# Patient Record
Sex: Male | Born: 1980 | Race: White | Hispanic: No | Marital: Single | Smoking: Current every day smoker
Health system: Southern US, Community
[De-identification: ages and names within clinical notes are randomized; demographics above are authoritative.]

---
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
## 2007-08-07 ENCOUNTER — Emergency Department (HOSPITAL_COMMUNITY): Admission: EM | Admit: 2007-08-07 | Discharge: 2007-08-07 | Payer: Self-pay | Admitting: Emergency Medicine

## 2009-09-04 ENCOUNTER — Emergency Department (HOSPITAL_COMMUNITY): Admission: EM | Admit: 2009-09-04 | Discharge: 2009-09-04 | Payer: Self-pay | Admitting: Emergency Medicine

## 2009-09-09 ENCOUNTER — Encounter: Payer: Self-pay | Admitting: Orthopedic Surgery

## 2009-09-10 ENCOUNTER — Ambulatory Visit: Payer: Self-pay | Admitting: Orthopedic Surgery

## 2009-09-10 DIAGNOSIS — S41109A Unspecified open wound of unspecified upper arm, initial encounter: Secondary | ICD-10-CM | POA: Insufficient documentation

## 2009-09-12 ENCOUNTER — Ambulatory Visit: Payer: Self-pay | Admitting: Orthopedic Surgery

## 2010-02-04 NOTE — Assessment & Plan Note (Signed)
Summary: RE-CK LT ARM/WOUND CHECK/SELF PAY/CAF   Visit Type:  Follow-up Referring Jose Banks:  ap er Primary Jose Banks:  [facility]. [facility]  CC:  recheck left arm wound.  History of Present Illness: I saw Jose Banks in the office today for an initial visit.  He is a 30 years old man with the complaint of:  left arm lacerations.  DOI 09/04/09.  He basically punched a window on August 31 and sustained the lacerations over the LEFT upper arm and forearm.  Current medications Percocet  Here for wound check and suture removal  His biceps tendon remains intact as stated previously a suture line looks good his sutures were removed he is discharged       Allergies: No Known Drug Allergies   Impression & Recommendations:  Problem # 1:  WOUND, OPEN, ARM, WITHOUT COMPLICATION (ICD-884.0) Assessment Improved  Orders: Est. Patient Level II (86578)  Patient Instructions: 1)  follow up as needed

## 2010-02-04 NOTE — Letter (Signed)
Summary: History form  History form   Imported By: Jacklynn Ganong 09/12/2009 16:22:00  _____________________________________________________________________  External Attachment:    Type:   Image     Comment:   External Document

## 2010-02-04 NOTE — Assessment & Plan Note (Signed)
Summary: [facility] LAC LEFT ARM/AWARE TO PAY$100/BSF   Vital Signs:  Patient profile:   30 year old male Height:      66 inches Weight:      124 pounds Pulse rate:   82 / minute Resp:     16 per minute  Vitals Entered By: Fuller Canada MD (September 10, 2009 2:06 PM)  Visit Type:  new patient Referring Provider:  ap er Primary Provider:  [facility]  CC:  left arm pain.  History of Present Illness: I saw Jose Banks in the office today for an initial visit.  He is a 30 years old man with the complaint of:  left arm lacerations.  DOI 09/04/09.  Had sutures and was given Percocet 5 from er 09/04/09.  Patient has a laceration 5 cm over the distal portion of the upper arm then one over the lateral forearm.  His sharp dull throbbing pain which is 6/10 and it is intermittent appears to be worsened with movement it's associated with tingling.  He basically punched a window on August 31 and sustained the lacerations  He is on Percocet for pain he took his sling off because it hurt more.  I measured his laceration hematoma 10 cm.  His biceps tendon is intact.      Allergies (verified): No Known Drug Allergies  Past History:  Past Medical History: Cerebral Palsy  Past Surgical History: heal condition knee hamstring hip  Family History: FH of Cancer:  Family History of Diabetes Family History Coronary Heart Disease male < 30 Family History of Arthritis  Social History: Patient is single.  unemployed smokes 1ppd drinks alcohol occasionally 16 oz of caffeine per day 10th grade ed.  Review of Systems General:  Denies weight loss, weight gain, fever, chills, and fatigue. Cardiac :  Denies chest pain, palpitations, fainting, and murmurs. Resp:  Complains of snoring; denies short of breath, wheezing, couch, tightness, pain on inspiration, and snoring . GI:  Denies heartburn, nausea, vomiting, diarrhea, constipation, and blood in your  stools. GU:  Denies frequency, urgency, difficulty urinating, painful urination, flank pain, and bleeding in urine. Neuro:  Complains of tingling; denies numbness, unsteady gait, dizziness, tremors, and seizure. MS:  Complains of joint pain, stiffness, and muscle pain; denies swelling, instability, redness, and heat. Endo:  Denies excessive thirst, exessive urination, and heat or cold intolerance. Psych:  Denies nervousness, depression, anxiety, and hallucinations. Derm:  Denies changes in the skin, poor healing, rash, itching, and redness. EENT:  Denies blurred or double vision, eye pain, redness, and watering. Immunology:  Complains of seasonal allergies; denies sinus problems and allergic to bee stings. Lymphatic:  Denies easy bleeding and brusing.  Physical Exam  Additional Exam:  Vital signs are recorded stable.  He is awake alert and oriented x3 mood and affect are normal.  Gait and station are normal.  There is somewhat of a defect in the muscle belly of the biceps but the tendon is intact with the finger hook passed into supination only has mild weakness his range of motion the elbow is normal his elbow is stable the skin lacerations as noted suture line looks a little red and both areas and there is a cream which he says is Neosporin on it the radial and ulnar pulses are normal there is minimal edema to the arm or no sensory deficits   Impression & Recommendations:  Problem # 1:  WOUND, OPEN, ARM, WITHOUT COMPLICATION (ICD-884.0) Assessment New  Orders: New Patient Level II (96045)  Patient Instructions: 1)  2 days remove sutures  2)  continue neosporin cream

## 2010-12-22 ENCOUNTER — Encounter: Payer: Self-pay | Admitting: *Deleted

## 2010-12-22 ENCOUNTER — Emergency Department (HOSPITAL_COMMUNITY): Payer: Self-pay

## 2010-12-22 ENCOUNTER — Emergency Department (HOSPITAL_COMMUNITY)
Admission: EM | Admit: 2010-12-22 | Discharge: 2010-12-22 | Disposition: A | Discharge status: Home / Self Care | Payer: Self-pay | Attending: Emergency Medicine | Admitting: Emergency Medicine

## 2010-12-22 DIAGNOSIS — R059 Cough, unspecified: Secondary | ICD-10-CM | POA: Insufficient documentation

## 2010-12-22 DIAGNOSIS — B349 Viral infection, unspecified: Secondary | ICD-10-CM

## 2010-12-22 DIAGNOSIS — J3489 Other specified disorders of nose and nasal sinuses: Secondary | ICD-10-CM | POA: Insufficient documentation

## 2010-12-22 DIAGNOSIS — R509 Fever, unspecified: Secondary | ICD-10-CM | POA: Insufficient documentation

## 2010-12-22 DIAGNOSIS — J111 Influenza due to unidentified influenza virus with other respiratory manifestations: Secondary | ICD-10-CM | POA: Insufficient documentation

## 2010-12-22 DIAGNOSIS — IMO0001 Reserved for inherently not codable concepts without codable children: Secondary | ICD-10-CM | POA: Insufficient documentation

## 2010-12-22 DIAGNOSIS — R05 Cough: Secondary | ICD-10-CM | POA: Insufficient documentation

## 2010-12-22 DIAGNOSIS — B9789 Other viral agents as the cause of diseases classified elsewhere: Secondary | ICD-10-CM | POA: Insufficient documentation

## 2010-12-22 DIAGNOSIS — R112 Nausea with vomiting, unspecified: Secondary | ICD-10-CM | POA: Insufficient documentation

## 2010-12-22 LAB — URINALYSIS, ROUTINE W REFLEX MICROSCOPIC
Ketones, ur: NEGATIVE mg/dL
Leukocytes, UA: NEGATIVE
Nitrite: NEGATIVE
pH: 8 (ref 5.0–8.0)

## 2010-12-22 LAB — BASIC METABOLIC PANEL
Calcium: 9.8 mg/dL (ref 8.4–10.5)
Chloride: 96 mEq/L (ref 96–112)
Creatinine, Ser: 1.15 mg/dL (ref 0.50–1.35)
GFR calc Af Amer: >90 mL/min (ref >90)
Sodium: 135 mEq/L (ref 135–145)

## 2010-12-22 LAB — URINE MICROSCOPIC-ADD ON

## 2010-12-22 MED ORDER — KETOROLAC TROMETHAMINE 30 MG/ML IJ SOLN
30.0000 mg | Freq: Once | INTRAMUSCULAR | Status: AC
  Administered 2010-12-22: 30 mg via INTRAVENOUS
  Filled 2010-12-22: qty 1

## 2010-12-22 MED ORDER — SODIUM CHLORIDE 0.9 % IV BOLUS (SEPSIS)
1000.0000 mL | Freq: Once | INTRAVENOUS | Status: AC
  Administered 2010-12-22: 1000 mL via INTRAVENOUS

## 2010-12-22 MED ORDER — PROMETHAZINE HCL 25 MG PO TABS: 25.0000 mg | ORAL_TABLET | Freq: Four times a day (QID) | ORAL | Status: AC | PRN

## 2010-12-22 MED ORDER — MORPHINE SULFATE 4 MG/ML IJ SOLN
4.0000 mg | Freq: Once | INTRAMUSCULAR | Status: AC
  Administered 2010-12-22: 4 mg via INTRAVENOUS
  Filled 2010-12-22: qty 1

## 2010-12-22 MED ORDER — ONDANSETRON HCL 4 MG/2ML IJ SOLN
4.0000 mg | Freq: Once | INTRAMUSCULAR | Status: AC
  Administered 2010-12-22: 4 mg via INTRAVENOUS
  Filled 2010-12-22: qty 2

## 2010-12-22 NOTE — ED Provider Notes (Signed)
History     CSN: [id number] Arrival date & time: 12/22/2010  8:35 PM   First MD Initiated Contact with Patient 12/22/10 2036      Chief Complaint  Patient presents with  . Nausea  . Emesis  . Generalized Body Aches    (Consider location/radiation/quality/duration/timing/severity/associated sxs/prior treatment) HPI Comments: The past 3 days patient states he's had cough, congestion, rhinorrhea, fever, chills. Fevers but subjective. For the past one to 2 days has had nausea and multiple episodes of nonbloody nonbilious emesis. States he is unable to tolerate any oral intake including fluids. Has no chest pain, abdominal pain, urinary symptoms. No diarrhea  Patient is a 30 y.o. male presenting with vomiting. The history is provided by the patient. No language interpreter was used.  Emesis  This is a new problem. The current episode started 2 days ago. The problem occurs 2 to 4 times per day. The problem has been gradually worsening. The emesis has an appearance of stomach contents. Maximum temperature: unknown. The fever has been present for 1 to 2 days. Associated symptoms include chills, cough, a fever, myalgias and URI. Pertinent negatives include no abdominal pain, no arthralgias, no diarrhea and no headaches.    History reviewed. No pertinent past medical history.  History reviewed. No pertinent past surgical history.  History reviewed. No pertinent family history.  History  Substance Use Topics  . Smoking status: Current Everyday Smoker -- 1.0 packs/day  . Smokeless tobacco: Not on file  . Alcohol Use: No      Review of Systems  Constitutional: Positive for fever, chills, activity change, appetite change and fatigue.  HENT: Positive for congestion and rhinorrhea. Negative for sore throat, neck pain and neck stiffness.   Respiratory: Positive for cough. Negative for chest tightness and shortness of breath.   Cardiovascular: Negative for chest pain and palpitations.    Gastrointestinal: Positive for nausea and vomiting. Negative for abdominal pain and diarrhea.  Genitourinary: Negative for dysuria, urgency, frequency and flank pain.  Musculoskeletal: Positive for myalgias. Negative for back pain and arthralgias.  Neurological: Negative for dizziness, weakness, light-headedness, numbness and headaches.  All other systems reviewed and are negative.    Allergies  Review of patient's allergies indicates no known allergies.  Home Medications   Current Outpatient Rx  Name Route Sig Dispense Refill  . PROMETHAZINE HCL 25 MG PO TABS Oral Take 1 tablet (25 mg total) by mouth every 6 (six) hours as needed for nausea. 20 tablet 0    BP 117/76  Pulse 87  Temp(Src) 98.8 F (37.1 C) (Oral)  Resp 18  Ht 5' 5.5" (1.664 m)  Wt 135 lb (61.236 kg)  BMI 22.12 kg/m2  SpO2 94%  Physical Exam  Nursing note and vitals reviewed. Constitutional: He is oriented to person, place, and time. He appears well-developed and well-nourished. No distress.       Appears uncomfortable but in no distress  HENT:  Head: Normocephalic and atraumatic.  Mouth/Throat: Oropharynx is clear and moist. No oropharyngeal exudate.  Eyes: Conjunctivae and EOM are normal. Pupils are equal, round, and reactive to light.  Neck: Normal range of motion. Neck supple.  Cardiovascular: Normal rate, regular rhythm, normal heart sounds and intact distal pulses.  Exam reveals no gallop and no friction rub.   No murmur heard. Pulmonary/Chest: Effort normal and breath sounds normal. No respiratory distress. He has no wheezes. He has no rales.  Abdominal: Soft. Bowel sounds are normal. There is no tenderness.  Musculoskeletal: Normal  range of motion. He exhibits no tenderness.  Lymphadenopathy:    He has no cervical adenopathy.  Neurological: He is alert and oriented to person, place, and time. No cranial nerve deficit.  Skin: Skin is warm and dry. No rash noted.    ED Course  Procedures  (including critical care time)  Labs Reviewed  BASIC METABOLIC PANEL - Abnormal; Notable for the following:    Glucose, Bld 101 (*)    GFR calc non Af Amer 85 (*)    All other components within normal limits  URINALYSIS, ROUTINE W REFLEX MICROSCOPIC   Dg Chest 2 View  12/22/2010  *RADIOLOGY REPORT*  Clinical Data: Cough, fever  CHEST - 2 VIEW  Comparison:  None.  Findings:  The heart size and mediastinal contours are within normal limits.  Both lungs are clear.  The visualized skeletal structures are unremarkable.  IMPRESSION: No active cardiopulmonary disease.  Original Report Authenticated By: Judie Petit. Ruel Favors, M.D.     1. Viral syndrome   2. Influenza       MDM  Patient likely has influenza-like illness. Basic metabolic panel and urine were obtained given his history of 3 days of vomiting unable to tolerate by mouth. There is no indication for an additional laboratory studies. Chest x-ray is performed and pending at time of this dictation. IV was placed he received antiemetics and medication for discomfort. Given his persistent nausea and vomiting he will require by mouth challenge prior to discharge.  I discussed the case with my colleague dr Deretha Emory who will discharge the patient if po challenge successful.  To be provided rx for phenergan.  Out of treatment window for influenza tx        Dayton Bailiff, MD 12/22/10 2157

## 2010-12-22 NOTE — ED Notes (Signed)
Patient states he feels better now than when he got here.

## 2010-12-22 NOTE — ED Notes (Addendum)
"  Sick for 3 days."  Reports fever and chills.  Nausea and vomiting.  Reports generalized body aches.  Reports taking Alka-Seltzer Cold to treat at home.  No relief.

## 2011-08-15 ENCOUNTER — Encounter (HOSPITAL_COMMUNITY): Payer: Self-pay | Admitting: *Deleted

## 2011-08-15 ENCOUNTER — Emergency Department (HOSPITAL_COMMUNITY)
Admission: EM | Admit: 2011-08-15 | Discharge: 2011-08-15 | Disposition: A | Discharge status: Home / Self Care | Payer: Self-pay | Attending: Emergency Medicine | Admitting: Emergency Medicine

## 2011-08-15 DIAGNOSIS — F17200 Nicotine dependence, unspecified, uncomplicated: Secondary | ICD-10-CM | POA: Insufficient documentation

## 2011-08-15 DIAGNOSIS — L02415 Cutaneous abscess of right lower limb: Secondary | ICD-10-CM

## 2011-08-15 DIAGNOSIS — L02419 Cutaneous abscess of limb, unspecified: Secondary | ICD-10-CM | POA: Insufficient documentation

## 2011-08-15 MED ORDER — DOXYCYCLINE HYCLATE 100 MG PO CAPS: 100.0000 mg | ORAL_CAPSULE | Freq: Two times a day (BID) | ORAL | Status: AC

## 2011-08-15 MED ORDER — LIDOCAINE HCL (PF) 1 % IJ SOLN
INTRAMUSCULAR | Status: AC
  Filled 2011-08-15: qty 5

## 2011-08-15 MED ORDER — DOXYCYCLINE HYCLATE 100 MG PO TABS
100.0000 mg | ORAL_TABLET | Freq: Once | ORAL | Status: AC
  Administered 2011-08-15: 100 mg via ORAL
  Filled 2011-08-15: qty 1

## 2011-08-15 NOTE — ED Provider Notes (Signed)
History     CSN: [id number]  Arrival date & time 08/15/11  1101   First MD Initiated Contact with Patient 08/15/11 1141      Chief Complaint  Patient presents with  . Abscess    (Consider location/radiation/quality/duration/timing/severity/associated sxs/prior treatment) HPI Comments: Had a "zit or an ingrown hair" that he squeezed.  It has since become red, swollen and tender.  No fever.  No h/o MRSA.  Patient is a 31 y.o. male presenting with abscess. The history is provided by the patient. No language interpreter was used.  Abscess  This is a new problem. Episode onset: several days ago. The problem has been gradually worsening. The abscess is present on the right upper leg. Pertinent negatives include no fever. There were no sick contacts. He has received no recent medical care.    History reviewed. No pertinent past medical history.  History reviewed. No pertinent past surgical history.  No family history on file.  History  Substance Use Topics  . Smoking status: Current Everyday Smoker -- 1.0 packs/day  . Smokeless tobacco: Not on file  . Alcohol Use: No      Review of Systems  Constitutional: Negative for fever and chills.  Skin:       Abscess   All other systems reviewed and are negative.    Allergies  Review of patient's allergies indicates no known allergies.  Home Medications   Current Outpatient Rx  Name Route Sig Dispense Refill  . DOXYCYCLINE HYCLATE 100 MG PO CAPS Oral Take 1 capsule (100 mg total) by mouth 2 (two) times daily. 20 capsule 0    BP 125/82  Pulse 60  Temp 98.4 F (36.9 C)  Resp 18  Ht 5' 5.5" (1.664 m)  Wt 125 lb (56.7 kg)  BMI 20.48 kg/m2  SpO2 100%  Physical Exam  Nursing note and vitals reviewed. Constitutional: He is oriented to person, place, and time. He appears well-developed and well-nourished.  HENT:  Head: Normocephalic and atraumatic.  Eyes: EOM are normal.  Neck: Normal range of motion.  Cardiovascular:  Normal rate, regular rhythm, normal heart sounds and intact distal pulses.   Pulmonary/Chest: Effort normal and breath sounds normal. No respiratory distress.  Abdominal: Soft. He exhibits no distension. There is no tenderness.  Musculoskeletal: Normal range of motion.       Legs: Neurological: He is alert and oriented to person, place, and time.  Skin: Skin is warm and dry.  Psychiatric: He has a normal mood and affect. Judgment normal.    ED Course  Procedures (including critical care time)  Labs Reviewed - No data to display No results found.   1. Abscess of right thigh       MDM  rx-doxycycline 100 mg, 20 Ibuprofen  Heat Return prn.        Evalina Field, [place] 08/15/11 1253

## 2011-08-15 NOTE — ED Provider Notes (Signed)
Medical screening examination/treatment/procedure(s) were performed by non-physician practitioner and as supervising physician I was immediately available for consultation/collaboration.  Donnetta Hutching, MD 08/15/11 1517

## 2011-08-15 NOTE — ED Notes (Signed)
Pt ? Abscess to right thigh area that started Monday, denise any drainage, pt states that he thought he could "pop" it and it would go away

## 2011-08-15 NOTE — ED Notes (Signed)
Pt presents to ED with abscess on left anterior thigh. Pt states he first noticed the area on Monday but initially thought it was an ingrown hair. Pt denies drainage from area but states redness has gotten worse each day.

## 2011-09-22 ENCOUNTER — Emergency Department (HOSPITAL_COMMUNITY): Payer: Self-pay

## 2011-09-22 ENCOUNTER — Emergency Department (HOSPITAL_COMMUNITY)
Admission: EM | Admit: 2011-09-22 | Discharge: 2011-09-22 | Disposition: A | Discharge status: Home / Self Care | Payer: Self-pay | Attending: Emergency Medicine | Admitting: Emergency Medicine

## 2011-09-22 ENCOUNTER — Encounter (HOSPITAL_COMMUNITY): Payer: Self-pay | Admitting: *Deleted

## 2011-09-22 DIAGNOSIS — W298XXA Contact with other powered powered hand tools and household machinery, initial encounter: Secondary | ICD-10-CM | POA: Insufficient documentation

## 2011-09-22 DIAGNOSIS — S61209A Unspecified open wound of unspecified finger without damage to nail, initial encounter: Secondary | ICD-10-CM | POA: Insufficient documentation

## 2011-09-22 DIAGNOSIS — F17200 Nicotine dependence, unspecified, uncomplicated: Secondary | ICD-10-CM | POA: Insufficient documentation

## 2011-09-22 DIAGNOSIS — S61219A Laceration without foreign body of unspecified finger without damage to nail, initial encounter: Secondary | ICD-10-CM

## 2011-09-22 MED ORDER — BACITRACIN ZINC 500 UNIT/GM EX OINT
TOPICAL_OINTMENT | CUTANEOUS | Status: AC
  Filled 2011-09-22: qty 0.9

## 2011-09-22 MED ORDER — LIDOCAINE HCL (PF) 1 % IJ SOLN
INTRAMUSCULAR | Status: AC
  Administered 2011-09-22: 20:00:00
  Filled 2011-09-22: qty 5

## 2011-09-22 NOTE — ED Notes (Signed)
Pt cut self with a hand saw, lac to right index finger, occurred at 0900 today, pt states bleeding continues after dressing taken off

## 2011-09-22 NOTE — ED Provider Notes (Signed)
History     CSN: [id number]  Arrival date & time 09/22/11  1830   First MD Initiated Contact with Patient 09/22/11 1926      Chief Complaint  Patient presents with  . Extremity Laceration    (Consider location/radiation/quality/duration/timing/severity/associated sxs/prior treatment) HPI Comments: Using a hand saw and slipped cutting R distal index finger.  DT UTD.  L hand dominant.  No other injuries or complaints.  The history is provided by the patient. No language interpreter was used.    History reviewed. No pertinent past medical history.  History reviewed. No pertinent past surgical history.  History reviewed. No pertinent family history.  History  Substance Use Topics  . Smoking status: Current Every Day Smoker -- 1.0 packs/day    Types: Cigarettes  . Smokeless tobacco: Not on file  . Alcohol Use: Yes     very rare      Review of Systems  Skin: Positive for wound.  Neurological: Negative for weakness and numbness.  All other systems reviewed and are negative.    Allergies  Review of patient's allergies indicates no known allergies.  Home Medications  No current outpatient prescriptions on file.  BP 140/84  Pulse 95  Temp 98.5 F (36.9 C) (Oral)  Resp 18  Ht 5' 5.5" (1.664 m)  Wt 125 lb (56.7 kg)  BMI 20.48 kg/m2  SpO2 98%  Physical Exam  Nursing note and vitals reviewed. Constitutional: He is oriented to person, place, and time. He appears well-developed and well-nourished.  HENT:  Head: Normocephalic and atraumatic.  Eyes: EOM are normal.  Neck: Normal range of motion.  Cardiovascular: Normal rate, regular rhythm, normal heart sounds and intact distal pulses.   Pulmonary/Chest: Effort normal and breath sounds normal. No respiratory distress.  Abdominal: Soft. He exhibits no distension. There is no tenderness.  Musculoskeletal: He exhibits tenderness.       Right hand: He exhibits tenderness and laceration. He exhibits normal range of  motion, no bony tenderness, normal capillary refill, no deformity and no swelling. normal sensation noted. Normal strength noted.       Hands: Neurological: He is alert and oriented to person, place, and time.  Skin: Skin is warm and dry.  Psychiatric: He has a normal mood and affect. Judgment normal.    ED Course  LACERATION REPAIR Date/Time: 09/22/2011 7:48 PM Performed by: Evalina Field Authorized by: Evalina Field Consent: Verbal consent obtained. Written consent not obtained. Risks and benefits: risks, benefits and alternatives were discussed Consent given by: patient Patient understanding: patient states understanding of the procedure being performed Patient consent: the patient's understanding of the procedure matches consent given Site marked: the operative site was not marked Imaging studies: imaging studies available Patient identity confirmed: verbally with patient Time out: Immediately prior to procedure a "time out" was called to verify the correct patient, procedure, equipment, support staff and site/side marked as required. Location: R 2nd finger. Laceration length: 2.8 cm Foreign bodies: no foreign bodies Tendon involvement: none Nerve involvement: none Vascular damage: no Anesthesia: local infiltration Local anesthetic: lidocaine 1% without epinephrine Anesthetic total: 2 ml Patient sedated: no Preparation: Patient was prepped and draped in the usual sterile fashion. Irrigation solution: saline Irrigation method: syringe Amount of cleaning: standard Debridement: none Degree of undermining: none Skin closure: 4-0 nylon Number of sutures: 5 Technique: simple Approximation: close Approximation difficulty: simple Dressing: 4x4 sterile gauze and antibiotic ointment Patient tolerance: Patient tolerated the procedure well with no immediate complications.   (including critical  care time)  Labs Reviewed - No data to display Dg Finger Index Right  09/22/2011   *RADIOLOGY REPORT*  Clinical Data: Right index finger laceration.  RIGHT INDEX FINGER 2+V  Comparison: None.  Findings: Bandaging noted.  No foreign body or underlying osseous abnormality.  IMPRESSION:  1.  No foreign body, fracture, or acute osseous abnormality observed.   Original Report Authenticated By: Dellia Cloud, M.D.      1. Finger laceration       MDM  No fxs  Wash/abx oint BID Suture removal in 8-10 days.        Evalina Field, [place] 09/22/11 2012

## 2011-09-22 NOTE — ED Notes (Signed)
Pt presents to ED with laceration to right index finger. Pt states he cut finger with a hand saw. Pt denies pain.

## 2011-09-23 NOTE — ED Provider Notes (Signed)
Medical screening examination/treatment/procedure(s) were performed by non-physician practitioner and as supervising physician I was immediately available for consultation/collaboration  Takiya Belmares R. Valerye Kobus, MD 09/23/11 0022 

## 2011-11-04 ENCOUNTER — Encounter (HOSPITAL_COMMUNITY): Payer: Self-pay | Admitting: *Deleted

## 2011-11-04 ENCOUNTER — Emergency Department (HOSPITAL_COMMUNITY)
Admission: EM | Admit: 2011-11-04 | Discharge: 2011-11-04 | Disposition: A | Discharge status: Home / Self Care | Payer: Self-pay | Attending: Emergency Medicine | Admitting: Emergency Medicine

## 2011-11-04 DIAGNOSIS — I951 Orthostatic hypotension: Secondary | ICD-10-CM | POA: Insufficient documentation

## 2011-11-04 DIAGNOSIS — M545 Low back pain, unspecified: Secondary | ICD-10-CM | POA: Insufficient documentation

## 2011-11-04 DIAGNOSIS — R630 Anorexia: Secondary | ICD-10-CM | POA: Insufficient documentation

## 2011-11-04 DIAGNOSIS — M5410 Radiculopathy, site unspecified: Secondary | ICD-10-CM

## 2011-11-04 DIAGNOSIS — IMO0002 Reserved for concepts with insufficient information to code with codable children: Secondary | ICD-10-CM | POA: Insufficient documentation

## 2011-11-04 DIAGNOSIS — F17200 Nicotine dependence, unspecified, uncomplicated: Secondary | ICD-10-CM | POA: Insufficient documentation

## 2011-11-04 DIAGNOSIS — R110 Nausea: Secondary | ICD-10-CM | POA: Insufficient documentation

## 2011-11-04 MED ORDER — IBUPROFEN 600 MG PO TABS: 600.0000 mg | ORAL_TABLET | Freq: Four times a day (QID) | ORAL | Status: DC | PRN

## 2011-11-04 MED ORDER — HYDROCODONE-ACETAMINOPHEN 5-325 MG PO TABS
2.0000 | ORAL_TABLET | Freq: Once | ORAL | Status: DC
  Filled 2011-11-04: qty 2

## 2011-11-04 MED ORDER — KETOROLAC TROMETHAMINE 60 MG/2ML IM SOLN
60.0000 mg | Freq: Once | INTRAMUSCULAR | Status: AC
  Administered 2011-11-04: 60 mg via INTRAMUSCULAR
  Filled 2011-11-04: qty 2

## 2011-11-04 MED ORDER — HYDROCODONE-ACETAMINOPHEN 5-325 MG PO TABS: 1.0000 | ORAL_TABLET | ORAL | Status: DC | PRN

## 2011-11-04 NOTE — ED Provider Notes (Signed)
History  This chart was scribed for Derwood Kaplan, MD by Shari Heritage. The patient was seen in room [facility]17/[facility]. Patient's care was started at 1146.     CSN: [id number]  Arrival date & time 11/04/11  1058   First MD Initiated Contact with Patient 11/04/11 1146      Chief Complaint  Patient presents with  . Dizziness    Patient is a 31 y.o. male presenting with back pain. The history is provided by the patient. No language interpreter was used.  Back Pain  This is a new problem. The current episode started yesterday. The problem occurs constantly. The problem has not changed since onset.The pain is associated with no known injury. The pain is present in the lumbar spine. The pain radiates to the left thigh and right thigh. The pain is moderate. The symptoms are aggravated by certain positions. Pertinent negatives include no numbness, no bowel incontinence, no bladder incontinence and no weakness. He has tried NSAIDs for the symptoms. The treatment provided mild relief.    HPI Comments: Jose Banks is a 31 y.o. male who presents to the Emergency Department complaining of moderate, constant, lumbar back pain that radiates down to his thighs bilaterally onset 1 day ago. Patient states that certain positions make pain worse. He has taken Ibuprofen and Alka Seltzer for relief. He denies urinary incontinence, bowel incontinence, numbness, tingling or weakness.   Patient is also complaining of lightheadedness onset 2 days ago. There is associated nausea and decreased appetite. Patient denies vomiting. Patient reports no other significant past medical or surgical history. He is a current every day smoker.   History reviewed. No pertinent past medical history.  History reviewed. No pertinent past surgical history.  No family history on file.  History  Substance Use Topics  . Smoking status: Current Every Day Smoker -- 1.0 packs/day    Types: Cigarettes  . Smokeless tobacco: Not on file   . Alcohol Use: Yes     very rare      Review of Systems  Constitutional: Positive for appetite change.  Gastrointestinal: Positive for nausea. Negative for vomiting and bowel incontinence.  Genitourinary: Negative for bladder incontinence.  Musculoskeletal: Positive for back pain.  Neurological: Positive for light-headedness. Negative for weakness and numbness.    Allergies  Review of patient's allergies indicates no known allergies.  Home Medications   Current Outpatient Rx  Name Route Sig Dispense Refill  . ASPIRIN EFFERVESCENT 325 MG PO TBEF Oral Take 325 mg by mouth every 6 (six) hours as needed. pain    . IBUPROFEN 400 MG PO TABS Oral Take 400 mg by mouth every 8 (eight) hours as needed. Back and leg pain and headache      BP 120/66  Pulse 99  Temp 98.5 F (36.9 C) (Oral)  Resp 20  Ht 5\' 6"  (1.676 m)  Wt 125 lb (56.7 kg)  BMI 20.18 kg/m2  SpO2 97%  Physical Exam  Constitutional: He is oriented to person, place, and time. He appears well-developed and well-nourished.  HENT:  Head: Normocephalic and atraumatic.  Mouth/Throat: Mucous membranes are normal. Mucous membranes are not dry.  Cardiovascular: Normal rate and regular rhythm.   No murmur heard. Pulmonary/Chest: Effort normal and breath sounds normal. No respiratory distress. He has no wheezes. He has no rales.  Abdominal: Soft. There is no hepatosplenomegaly or hepatomegaly. There is tenderness (mild general periumbilical tenderness) in the periumbilical area. There is no rebound, no guarding and no CVA tenderness.  No flank tenderness.  Musculoskeletal: Normal range of motion.       Lumbar back: He exhibits tenderness.       No step offs. Tenderness around L2 region. No erythema. Positive straight leg with right worse than left.  Motor strength of lower extremities is intact bilaterally.  Neurological: He is alert and oriented to person, place, and time.  Skin: Skin is warm and dry.  Psychiatric:  He has a normal mood and affect. His behavior is normal.    ED Course  Procedures (including critical care time) DIAGNOSTIC STUDIES: Oxygen Saturation is 97% on room air, adequate by my interpretation.    COORDINATION OF CARE: 1:01pm- Patient informed of current plan for treatment and evaluation and agrees with plan at this time.      Labs Reviewed - No data to display No results found.   No diagnosis found.    MDM  Medical screening examination/treatment/procedure(s) were performed by me as the supervising physician. Scribe service was utilized for documentation only.  Pt comes in with cc of dizziness. Dizziness is described as lightheadedness. It is positional, there is no associated chest pain, sob, palpitations and he denies any fluid loss to account for orthostasis Pt also c/o of some back pain, radiating down to the legs, and he might be having radicular pain based on the hx. The exam is negative except for straight leg and focal tenderness in the lower thoracic, lumbar region. Pt denies any travel abroad, hx of TB, and he is immunocompetent and at no risk for pathologic fractures or metastasis. No indication for imaging based on above. Will control pain, check orthostatics, which if positive will encourage po hydration.  2:47 PM O/S vitals are equivocal - but the HR did jump by 20+ - so we will encourage po hydration.   Derwood Kaplan, MD 11/04/11 1447

## 2011-11-04 NOTE — ED Notes (Signed)
Cup of water given for fluid challenge, tolerating well.

## 2011-11-04 NOTE — ED Notes (Signed)
C/o swimmy headed, cannot keep anything down, back and leg pain onset yesterday

## 2014-03-03 ENCOUNTER — Emergency Department (HOSPITAL_COMMUNITY): Payer: Self-pay

## 2014-03-03 ENCOUNTER — Encounter (HOSPITAL_COMMUNITY): Payer: Self-pay

## 2014-03-03 ENCOUNTER — Emergency Department (HOSPITAL_COMMUNITY)
Admission: EM | Admit: 2014-03-03 | Discharge: 2014-03-03 | Disposition: A | Discharge status: Home / Self Care | Payer: Self-pay | Attending: Emergency Medicine | Admitting: Emergency Medicine

## 2014-03-03 DIAGNOSIS — Y9389 Activity, other specified: Secondary | ICD-10-CM | POA: Insufficient documentation

## 2014-03-03 DIAGNOSIS — Y998 Other external cause status: Secondary | ICD-10-CM | POA: Insufficient documentation

## 2014-03-03 DIAGNOSIS — Y9289 Other specified places as the place of occurrence of the external cause: Secondary | ICD-10-CM | POA: Insufficient documentation

## 2014-03-03 DIAGNOSIS — S60222A Contusion of left hand, initial encounter: Secondary | ICD-10-CM | POA: Insufficient documentation

## 2014-03-03 DIAGNOSIS — Z720 Tobacco use: Secondary | ICD-10-CM | POA: Insufficient documentation

## 2014-03-03 DIAGNOSIS — S60512A Abrasion of left hand, initial encounter: Secondary | ICD-10-CM

## 2014-03-03 DIAGNOSIS — W228XXA Striking against or struck by other objects, initial encounter: Secondary | ICD-10-CM | POA: Insufficient documentation

## 2014-03-03 MED ORDER — AMOXICILLIN-POT CLAVULANATE 875-125 MG PO TABS: 1.0000 | ORAL_TABLET | Freq: Two times a day (BID) | ORAL | Status: AC

## 2014-03-03 MED ORDER — OXYCODONE-ACETAMINOPHEN 5-325 MG PO TABS: 1.0000 | ORAL_TABLET | ORAL | Status: AC | PRN

## 2014-03-03 NOTE — Discharge Instructions (Signed)
Contusion °A contusion is a deep bruise. Contusions are the result of an injury that caused bleeding under the skin. The contusion may turn blue, purple, or yellow. Minor injuries will give you a painless contusion, but more severe contusions may stay painful and swollen for a few weeks.  °CAUSES  °A contusion is usually caused by a blow, trauma, or direct force to an area of the body. °SYMPTOMS  °· Swelling and redness of the injured area. °· Bruising of the injured area. °· Tenderness and soreness of the injured area. °· Pain. °DIAGNOSIS  °The diagnosis can be made by taking a history and physical exam. An X-ray, CT scan, or MRI may be needed to determine if there were any associated injuries, such as fractures. °TREATMENT  °Specific treatment will depend on what area of the body was injured. In general, the best treatment for a contusion is resting, icing, elevating, and applying cold compresses to the injured area. Over-the-counter medicines may also be recommended for pain control. Ask your caregiver what the best treatment is for your contusion. °HOME CARE INSTRUCTIONS  °· Put ice on the injured area. °¨ Put ice in a plastic bag. °¨ Place a towel between your skin and the bag. °¨ Leave the ice on for 15-20 minutes, 3-4 times a day, or as directed by your health care provider. °· Only take over-the-counter or prescription medicines for pain, discomfort, or fever as directed by your caregiver. Your caregiver may recommend avoiding anti-inflammatory medicines (aspirin, ibuprofen, and naproxen) for 48 hours because these medicines may increase bruising. °· Rest the injured area. °· If possible, elevate the injured area to reduce swelling. °SEEK IMMEDIATE MEDICAL CARE IF:  °· You have increased bruising or swelling. °· You have pain that is getting worse. °· Your swelling or pain is not relieved with medicines. °MAKE SURE YOU:  °· Understand these instructions. °· Will watch your condition. °· Will get help right  away if you are not doing well or get worse. °Document Released: 10/01/2004 Document Revised: 12/27/2012 Document Reviewed: 10/27/2010 °[employer]® Patient Information ©2015 [employer]. This information is not intended to replace advice given to you by your health care provider. Make sure you discuss any questions you have with your health care provider. ° °

## 2014-03-03 NOTE — ED Notes (Signed)
Pt states he was loading wood yesterday and hit his left hand on angle iron
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
## 2016-04-24 IMAGING — CR DG HAND COMPLETE 3+V*L*
3 series · 3 of 3 positions shown · non-contrast
Comparison: None.

CLINICAL DATA: Hit hand on metal with resolved swelling and pain.

EXAM:
LEFT HAND - COMPLETE 3+ VIEW

[view not recorded (1 of 3)]
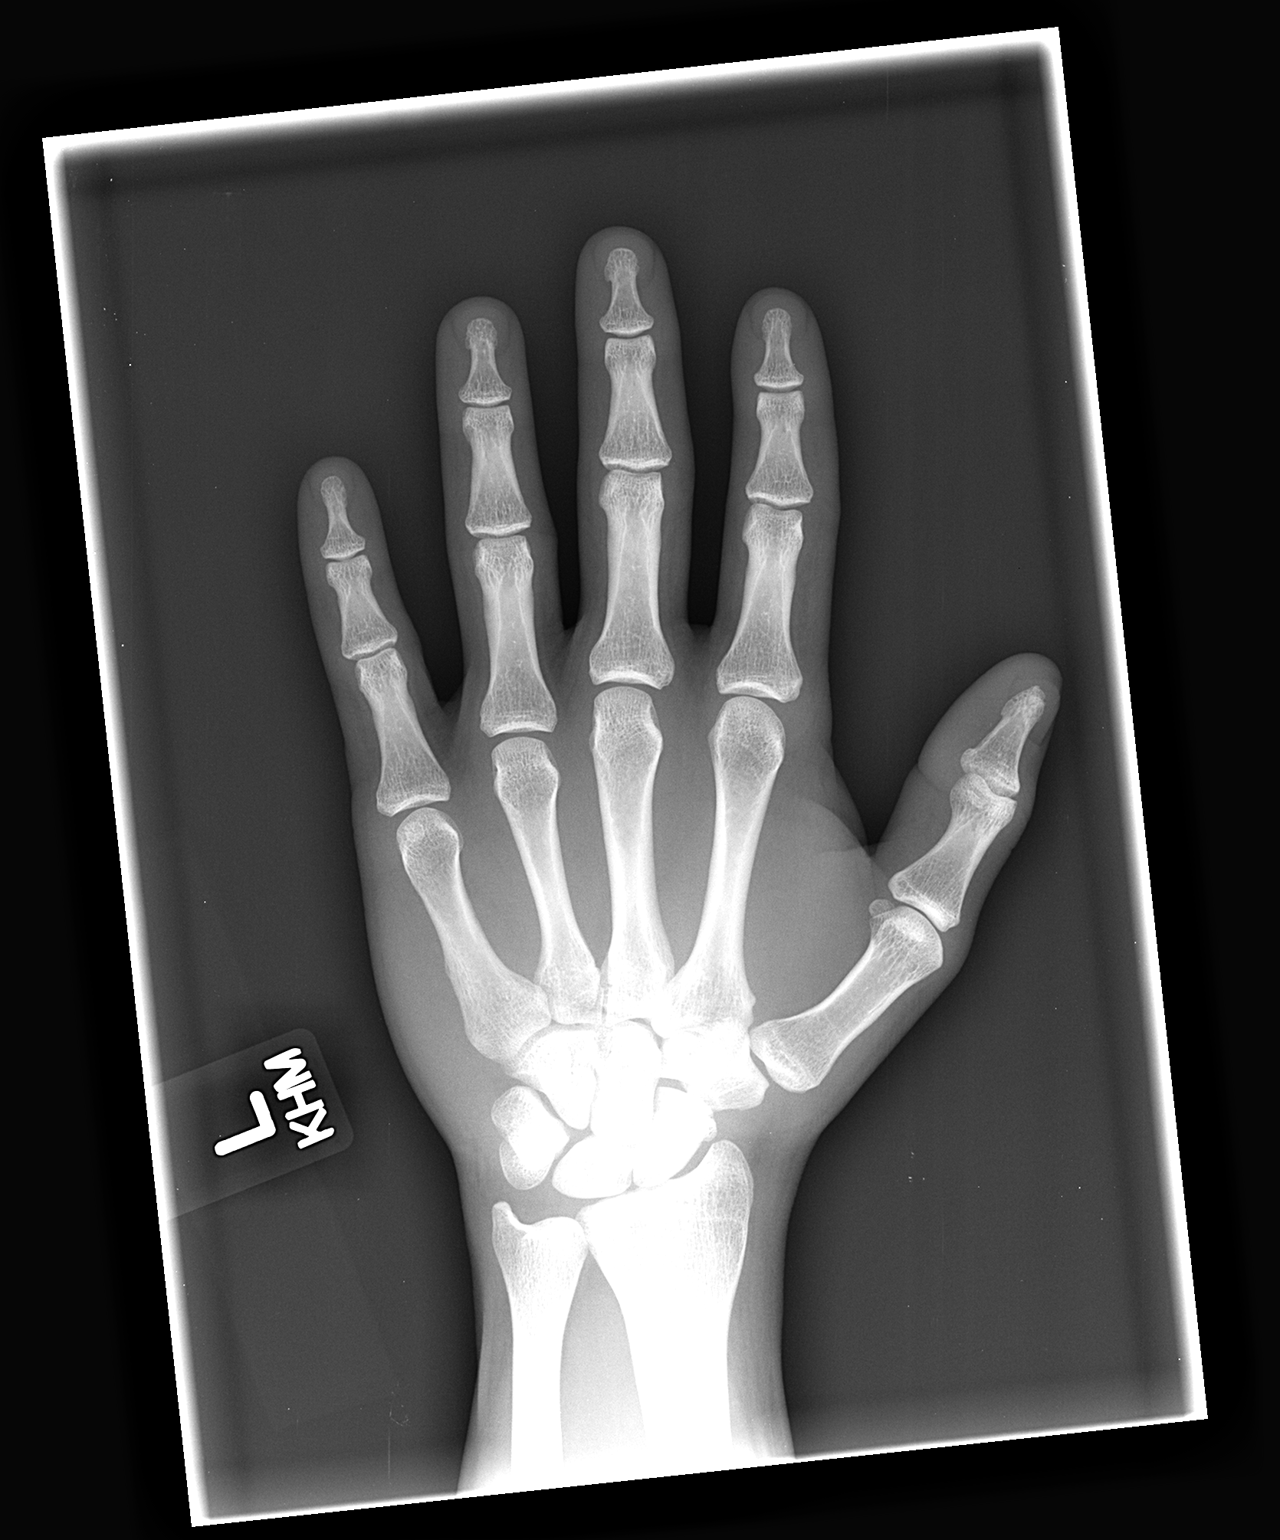

[view not recorded (2 of 3)]
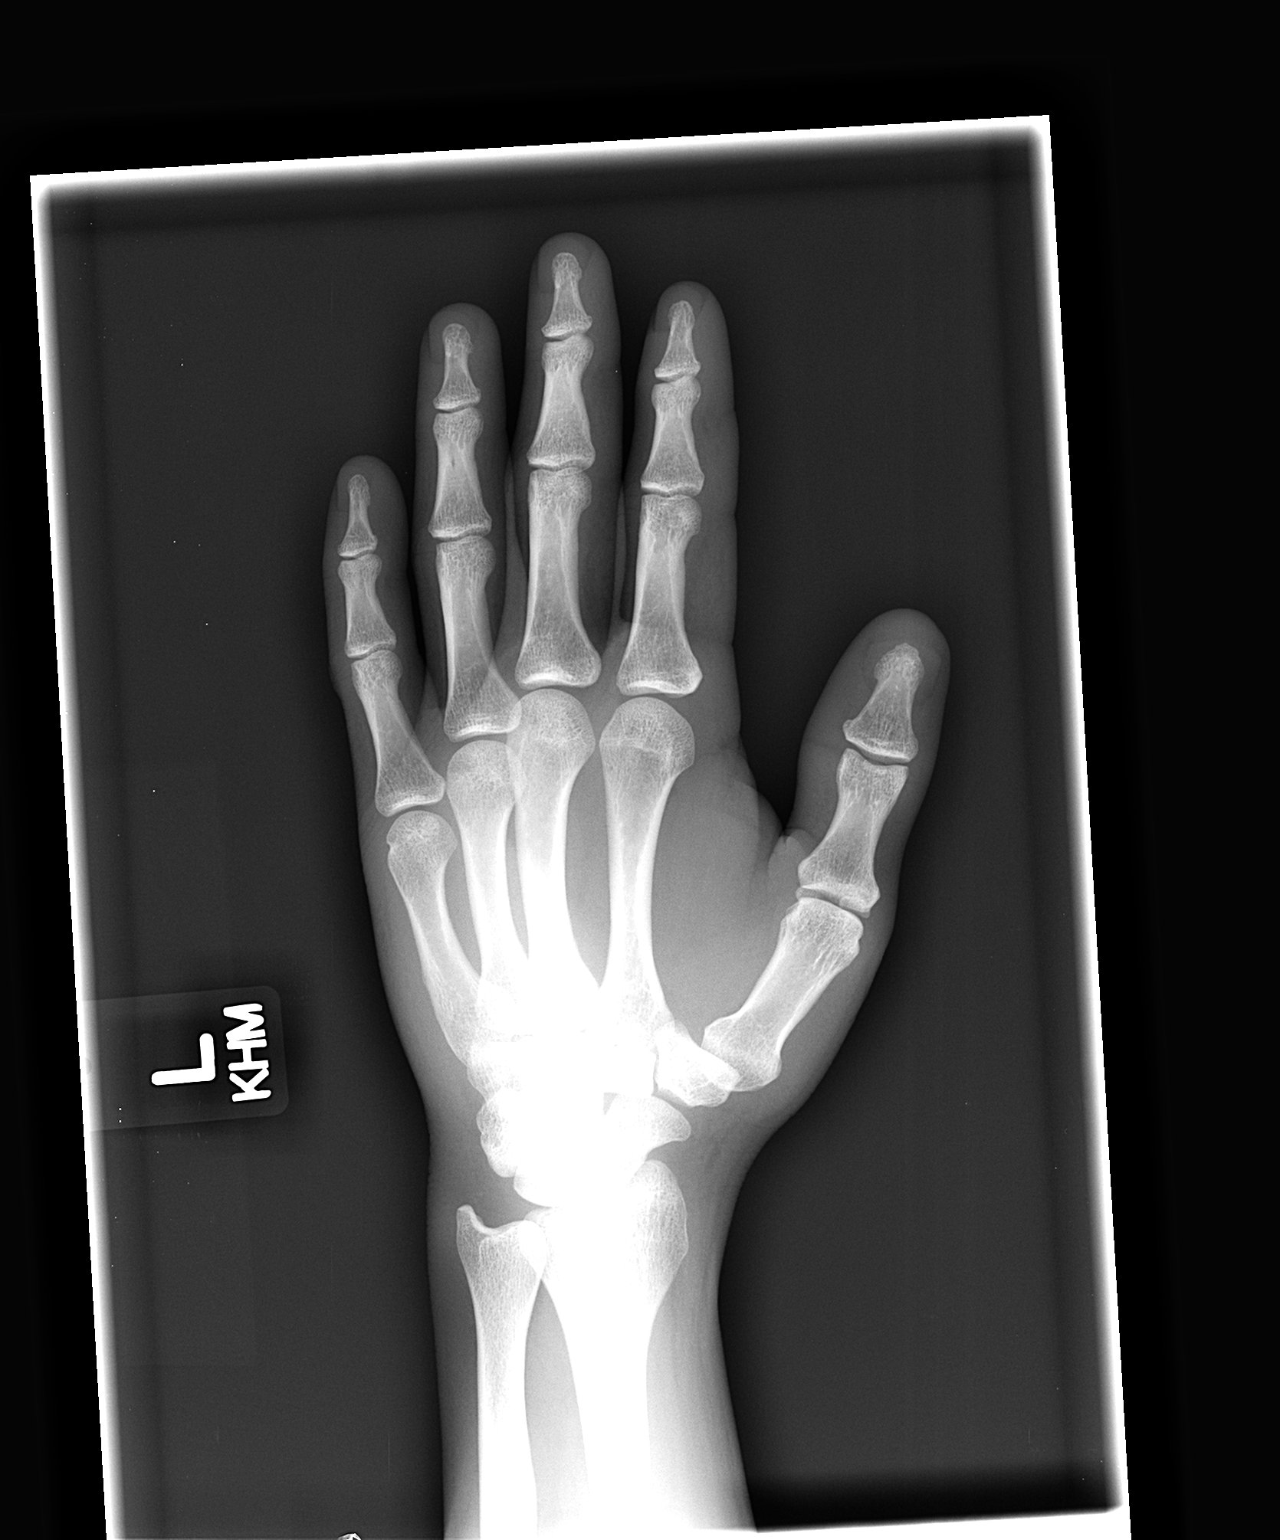

[view not recorded (3 of 3)]
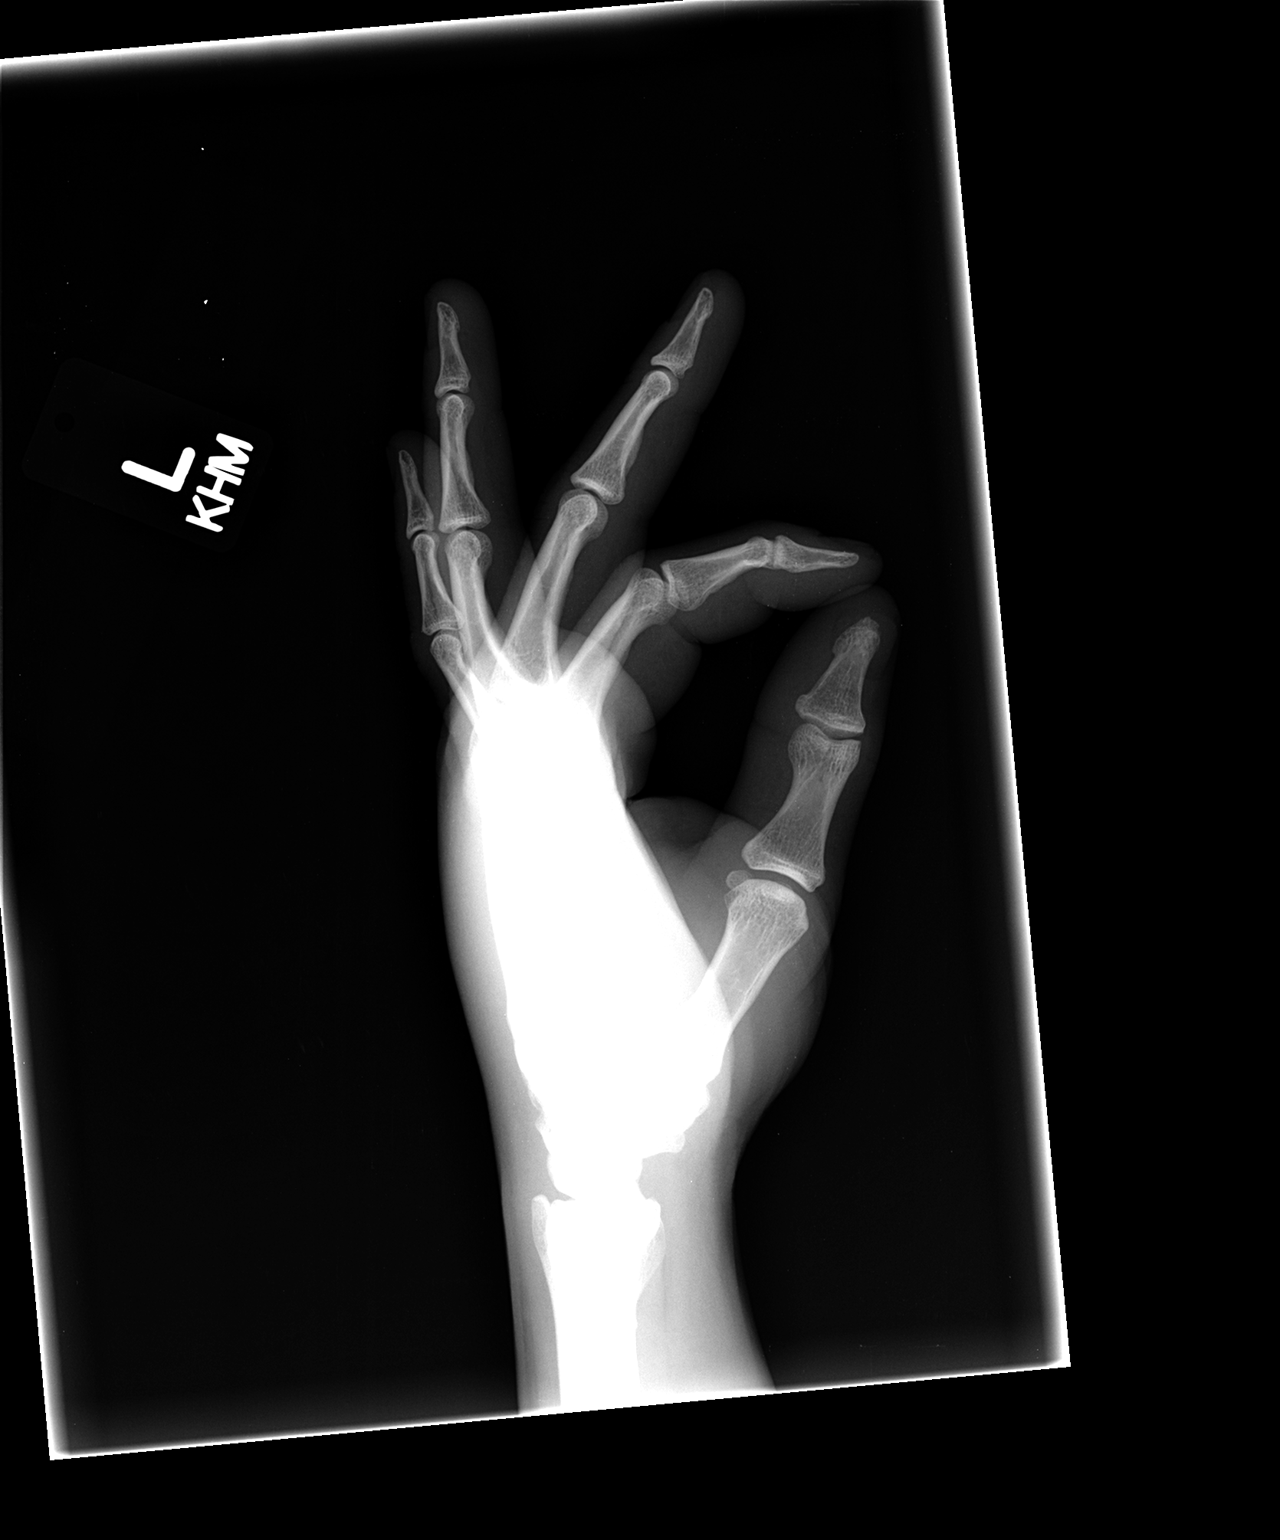

[3 of 3 positions shown; findings below may reference images not displayed]

FINDINGS: There is no evidence of fracture or dislocation. There is no
evidence of arthropathy or other focal bone abnormality. Soft
tissues are unremarkable.
IMPRESSION: Some soft tissue swelling is noted in the metacarpal region although
no acute fracture or dislocation is noted.
